# Patient Record
Sex: Male | Born: 1975 | Hispanic: No | Marital: Married
Health system: Southern US, Community
[De-identification: ages and names within clinical notes are randomized; demographics above are authoritative.]

## PROBLEM LIST (undated history)

## (undated) DIAGNOSIS — E785 Hyperlipidemia, unspecified: Secondary | ICD-10-CM

## (undated) DIAGNOSIS — E119 Type 2 diabetes mellitus without complications: Secondary | ICD-10-CM

## (undated) DIAGNOSIS — M109 Gout, unspecified: Secondary | ICD-10-CM

## (undated) HISTORY — DX: Type 2 diabetes mellitus without complications: E11.9

## (undated) HISTORY — DX: Gout, unspecified: M10.9

## (undated) HISTORY — DX: Hyperlipidemia, unspecified: E78.5

## (undated) HISTORY — PX: APPENDECTOMY: SHX54

---

## 2015-10-11 ENCOUNTER — Ambulatory Visit (INDEPENDENT_AMBULATORY_CARE_PROVIDER_SITE_OTHER): Payer: BLUE CROSS/BLUE SHIELD | Admitting: Family Medicine

## 2015-10-11 VITALS — BP 124/70 | HR 75 | Temp 98.3°F | Resp 18 | Ht 68.0 in | Wt 191.4 lb

## 2015-10-11 DIAGNOSIS — S61219A Laceration without foreign body of unspecified finger without damage to nail, initial encounter: Secondary | ICD-10-CM

## 2015-10-11 DIAGNOSIS — S61210A Laceration without foreign body of right index finger without damage to nail, initial encounter: Secondary | ICD-10-CM

## 2015-10-11 DIAGNOSIS — Z23 Encounter for immunization: Secondary | ICD-10-CM | POA: Diagnosis not present

## 2015-10-11 NOTE — Progress Notes (Signed)
   Subjective:  This chart was scribed for Norberto SorensonEva Shaw MD, by Veverly FellsHatice Demirci,scribe, at [facility].  This patient was seen in room 1 and the patient's care was started at 1:49 PM.    Patient ID: Micheal Herrera, male    DOB: 1976-12-03, 39 y.o.   MRN: [id number] Chief Complaint  Patient presents with  . OTHER    cut finger right hand    HPI  HPI Comments: Micheal Herrera is a 39 y.o. male who presents to the [facility] complaining of a cut on his right index finger (on stainless steel) which occurred prior to arrival while moving things around. Patient does not recall his last tetanus shot.  Patient denies feeling dizzy currently.  He has no other complaints or concerns today.    Past Medical History  Diagnosis Date  . Diabetes mellitus without complication (HCC)   . Hyperlipidemia   . Gout     No current outpatient prescriptions on file prior to visit.   No current facility-administered medications on file prior to visit.    Allergies  Allergen Reactions  . Penicillins          Review of Systems  Constitutional: Negative for fever and chills.  Eyes: Negative for pain, redness and itching.  Respiratory: Negative for cough and shortness of breath.   Gastrointestinal: Negative for nausea and vomiting.  Musculoskeletal: Negative for neck pain and neck stiffness.  Skin: Positive for wound.  Neurological: Negative for syncope and speech difficulty.       Objective:   Physical Exam  Constitutional: He is oriented to person, place, and time. He appears well-developed and well-nourished.  HENT:  Head: Normocephalic and atraumatic.  Eyes: Pupils are equal, round, and reactive to light.  Pulmonary/Chest: No respiratory distress.  Neurological: He is alert and oriented to person, place, and time.  Skin: Skin is warm and dry.  Right index finger: 1 cm by .5 cm missing divot in his palm.  Looks like it is to the base of the fat and does not  appear to break though the underlying fascia.  Not hemostatic and no known debris.    Filed Vitals:   10/11/15 1332  BP: 124/70  Pulse: 75  Temp: 98.3 F (36.8 C)  TempSrc: Oral  Resp: 18  Height: 5\' 8"  (1.727 m)  Weight: 191 lb 6.4 oz (86.818 kg)  SpO2: 98%          Assessment & Plan:   1. Laceration of finger of right hand, initial encounter     Orders Placed This Encounter  Procedures  . Tdap vaccine greater than or equal to 7yo IM    Meds ordered this encounter  Medications  . metFORMIN (GLUMETZA) 1000 MG (MOD) 24 hr tablet    Sig: Take 1,000 mg by mouth daily with breakfast.  . simvastatin (ZOCOR) 10 MG tablet    Sig: Take 10 mg by mouth daily.  Marland Kitchen. allopurinol (ZYLOPRIM) 100 MG tablet    Sig: Take 100 mg by mouth daily.    I personally performed the services described in this documentation, which was scribed in my presence. The recorded information has been reviewed and considered, and addended by me as needed.  Norberto SorensonEva Shaw, MD MPH

## 2015-10-11 NOTE — Progress Notes (Signed)
Procedure Consent obtained. 2% lido local anesthesia. Pinrose drain applied. Cleaned with soap and water. Wound explored. Xeroform applied. Clean dressing placed.

## 2015-10-13 ENCOUNTER — Ambulatory Visit (INDEPENDENT_AMBULATORY_CARE_PROVIDER_SITE_OTHER): Payer: BLUE CROSS/BLUE SHIELD | Admitting: Physician Assistant

## 2015-10-13 VITALS — BP 126/80 | HR 90 | Temp 98.4°F | Resp 17 | Ht 67.5 in | Wt 192.0 lb

## 2015-10-13 DIAGNOSIS — Z5189 Encounter for other specified aftercare: Secondary | ICD-10-CM

## 2015-10-13 NOTE — Progress Notes (Signed)
   Subjective:    Patient ID: Micheal Herrera, male    DOB: 16-Oct-1976, 39 y.o.   MRN: [id number]  Chief Complaint  Patient presents with  . Follow-up    wound care    HPI  [age] yom returns for wound check.   Seen 2 days ago. Pinched right index finger fat pad between 2 metal pieces and lost small chunk from fat pad. We cleaned and applied xeroform dressing 2 days ago. Returns for recheck.   Has kept wrapped since seeing us. Denies fevers, chills. Denies drainage.   Review of Systems See HPI     Objective:   Physical Exam  Constitutional: He appears well-developed and well-nourished.  Non-toxic appearance. He does not have a sickly appearance. He does not appear ill. No distress.  BP 126/80 mmHg  Pulse 90  Temp(Src) 98.4 F (36.9 C) (Oral)  Resp 17  Ht 5' 7.5" (1.715 m)  Wt 192 lb (87.091 kg)  BMI 29.61 kg/m2  SpO2 98%   Skin:  Dressing removed from right index finger. Xeroform gauze left in place over 1cm abrasion. No purulence. No surrounding erythema. Wound rewrapped.       Assessment & Plan:   Visit for wound check --wound healing well --xeroform gauze left in place, wound rewrapped, no signs infection --instructed to clean with soap/water daily, continue to cover while working but leave open while at home --rtc with fevers, chills, purulence, surrounding erythema  [name] M. [name], PA-C Physician Assistant-Certified [facility]  10/13/2015 2:54 PM

## 2016-02-22 ENCOUNTER — Ambulatory Visit
Admission: RE | Admit: 2016-02-22 | Discharge: 2016-02-22 | Disposition: A | Discharge status: Home / Self Care | Payer: BLUE CROSS/BLUE SHIELD | Source: Ambulatory Visit | Attending: Family Medicine | Admitting: Family Medicine

## 2016-02-22 ENCOUNTER — Other Ambulatory Visit: Payer: Self-pay | Admitting: Family Medicine

## 2016-02-22 DIAGNOSIS — M5416 Radiculopathy, lumbar region: Secondary | ICD-10-CM

## 2016-02-23 ENCOUNTER — Ambulatory Visit
Admission: RE | Admit: 2016-02-23 | Discharge: 2016-02-23 | Disposition: A | Discharge status: Home / Self Care | Payer: BLUE CROSS/BLUE SHIELD | Source: Ambulatory Visit | Attending: Family Medicine | Admitting: Family Medicine

## 2016-02-23 ENCOUNTER — Other Ambulatory Visit: Payer: Self-pay | Admitting: Family Medicine

## 2016-02-23 DIAGNOSIS — M25512 Pain in left shoulder: Secondary | ICD-10-CM

## 2016-05-30 DIAGNOSIS — H52223 Regular astigmatism, bilateral: Secondary | ICD-10-CM | POA: Diagnosis not present

## 2016-05-30 DIAGNOSIS — E119 Type 2 diabetes mellitus without complications: Secondary | ICD-10-CM | POA: Diagnosis not present

## 2016-08-06 ENCOUNTER — Other Ambulatory Visit: Payer: Self-pay | Admitting: Family Medicine

## 2016-08-06 DIAGNOSIS — M25512 Pain in left shoulder: Secondary | ICD-10-CM

## 2016-08-10 ENCOUNTER — Ambulatory Visit
Admission: RE | Admit: 2016-08-10 | Discharge: 2016-08-10 | Disposition: A | Discharge status: Home / Self Care | Payer: BLUE CROSS/BLUE SHIELD | Source: Ambulatory Visit | Attending: Family Medicine | Admitting: Family Medicine

## 2016-08-10 DIAGNOSIS — M25512 Pain in left shoulder: Secondary | ICD-10-CM

## 2016-08-10 DIAGNOSIS — S43432A Superior glenoid labrum lesion of left shoulder, initial encounter: Secondary | ICD-10-CM | POA: Diagnosis not present

## 2016-08-20 DIAGNOSIS — M7542 Impingement syndrome of left shoulder: Secondary | ICD-10-CM | POA: Diagnosis not present

## 2016-08-20 DIAGNOSIS — M25512 Pain in left shoulder: Secondary | ICD-10-CM | POA: Diagnosis not present

## 2016-08-20 DIAGNOSIS — M7582 Other shoulder lesions, left shoulder: Secondary | ICD-10-CM | POA: Diagnosis not present

## 2016-12-06 DIAGNOSIS — E119 Type 2 diabetes mellitus without complications: Secondary | ICD-10-CM | POA: Diagnosis not present

## 2016-12-06 DIAGNOSIS — M109 Gout, unspecified: Secondary | ICD-10-CM | POA: Diagnosis not present

## 2016-12-06 DIAGNOSIS — E785 Hyperlipidemia, unspecified: Secondary | ICD-10-CM | POA: Diagnosis not present

## 2016-12-10 DIAGNOSIS — E790 Hyperuricemia without signs of inflammatory arthritis and tophaceous disease: Secondary | ICD-10-CM | POA: Diagnosis not present

## 2016-12-10 DIAGNOSIS — R5383 Other fatigue: Secondary | ICD-10-CM | POA: Diagnosis not present

## 2016-12-10 DIAGNOSIS — M109 Gout, unspecified: Secondary | ICD-10-CM | POA: Diagnosis not present

## 2016-12-10 DIAGNOSIS — E119 Type 2 diabetes mellitus without complications: Secondary | ICD-10-CM | POA: Diagnosis not present

## 2016-12-10 DIAGNOSIS — E785 Hyperlipidemia, unspecified: Secondary | ICD-10-CM | POA: Diagnosis not present

## 2016-12-23 DIAGNOSIS — E291 Testicular hypofunction: Secondary | ICD-10-CM | POA: Diagnosis not present

## 2017-03-20 DIAGNOSIS — M7070 Other bursitis of hip, unspecified hip: Secondary | ICD-10-CM | POA: Diagnosis not present

## 2017-08-15 DIAGNOSIS — M109 Gout, unspecified: Secondary | ICD-10-CM | POA: Diagnosis not present

## 2017-08-15 DIAGNOSIS — E1165 Type 2 diabetes mellitus with hyperglycemia: Secondary | ICD-10-CM | POA: Diagnosis not present

## 2017-08-15 DIAGNOSIS — E785 Hyperlipidemia, unspecified: Secondary | ICD-10-CM | POA: Diagnosis not present

## 2017-08-15 DIAGNOSIS — Z7984 Long term (current) use of oral hypoglycemic drugs: Secondary | ICD-10-CM | POA: Diagnosis not present

## 2017-08-15 DIAGNOSIS — E291 Testicular hypofunction: Secondary | ICD-10-CM | POA: Diagnosis not present

## 2017-08-19 DIAGNOSIS — E291 Testicular hypofunction: Secondary | ICD-10-CM | POA: Diagnosis not present

## 2017-08-19 DIAGNOSIS — E119 Type 2 diabetes mellitus without complications: Secondary | ICD-10-CM | POA: Diagnosis not present

## 2017-08-19 DIAGNOSIS — E785 Hyperlipidemia, unspecified: Secondary | ICD-10-CM | POA: Diagnosis not present

## 2017-08-19 DIAGNOSIS — E790 Hyperuricemia without signs of inflammatory arthritis and tophaceous disease: Secondary | ICD-10-CM | POA: Diagnosis not present

## 2017-08-28 DIAGNOSIS — E291 Testicular hypofunction: Secondary | ICD-10-CM | POA: Diagnosis not present

## 2017-10-28 DIAGNOSIS — H43812 Vitreous degeneration, left eye: Secondary | ICD-10-CM | POA: Diagnosis not present

## 2017-10-28 DIAGNOSIS — E119 Type 2 diabetes mellitus without complications: Secondary | ICD-10-CM | POA: Diagnosis not present

## 2018-03-19 DIAGNOSIS — E782 Mixed hyperlipidemia: Secondary | ICD-10-CM | POA: Diagnosis not present

## 2018-03-19 DIAGNOSIS — E790 Hyperuricemia without signs of inflammatory arthritis and tophaceous disease: Secondary | ICD-10-CM | POA: Diagnosis not present

## 2018-03-19 DIAGNOSIS — M1990 Unspecified osteoarthritis, unspecified site: Secondary | ICD-10-CM | POA: Diagnosis not present

## 2018-03-19 DIAGNOSIS — E119 Type 2 diabetes mellitus without complications: Secondary | ICD-10-CM | POA: Diagnosis not present

## 2018-03-19 DIAGNOSIS — Z79899 Other long term (current) drug therapy: Secondary | ICD-10-CM | POA: Diagnosis not present

## 2018-03-19 DIAGNOSIS — E291 Testicular hypofunction: Secondary | ICD-10-CM | POA: Diagnosis not present

## 2018-03-19 DIAGNOSIS — E1165 Type 2 diabetes mellitus with hyperglycemia: Secondary | ICD-10-CM | POA: Diagnosis not present

## 2018-03-19 DIAGNOSIS — Z125 Encounter for screening for malignant neoplasm of prostate: Secondary | ICD-10-CM | POA: Diagnosis not present

## 2018-10-20 DIAGNOSIS — K649 Unspecified hemorrhoids: Secondary | ICD-10-CM | POA: Diagnosis not present

## 2018-11-24 DIAGNOSIS — E790 Hyperuricemia without signs of inflammatory arthritis and tophaceous disease: Secondary | ICD-10-CM | POA: Diagnosis not present

## 2018-11-24 DIAGNOSIS — E119 Type 2 diabetes mellitus without complications: Secondary | ICD-10-CM | POA: Diagnosis not present

## 2018-11-24 DIAGNOSIS — E782 Mixed hyperlipidemia: Secondary | ICD-10-CM | POA: Diagnosis not present

## 2018-11-24 DIAGNOSIS — Z5181 Encounter for therapeutic drug level monitoring: Secondary | ICD-10-CM | POA: Diagnosis not present

## 2018-11-24 DIAGNOSIS — M1990 Unspecified osteoarthritis, unspecified site: Secondary | ICD-10-CM | POA: Diagnosis not present

## 2018-11-24 DIAGNOSIS — E291 Testicular hypofunction: Secondary | ICD-10-CM | POA: Diagnosis not present

## 2019-05-07 DIAGNOSIS — R05 Cough: Secondary | ICD-10-CM | POA: Diagnosis not present

## 2019-05-07 DIAGNOSIS — B351 Tinea unguium: Secondary | ICD-10-CM | POA: Diagnosis not present

## 2019-05-07 DIAGNOSIS — E790 Hyperuricemia without signs of inflammatory arthritis and tophaceous disease: Secondary | ICD-10-CM | POA: Diagnosis not present

## 2019-05-07 DIAGNOSIS — E119 Type 2 diabetes mellitus without complications: Secondary | ICD-10-CM | POA: Diagnosis not present

## 2019-05-18 DIAGNOSIS — Z7289 Other problems related to lifestyle: Secondary | ICD-10-CM | POA: Diagnosis not present

## 2019-05-18 DIAGNOSIS — K219 Gastro-esophageal reflux disease without esophagitis: Secondary | ICD-10-CM | POA: Diagnosis not present

## 2019-05-18 DIAGNOSIS — J342 Deviated nasal septum: Secondary | ICD-10-CM | POA: Diagnosis not present

## 2019-05-18 DIAGNOSIS — R0981 Nasal congestion: Secondary | ICD-10-CM | POA: Diagnosis not present

## 2019-05-27 ENCOUNTER — Other Ambulatory Visit: Payer: Self-pay | Admitting: Otolaryngology

## 2019-05-27 DIAGNOSIS — R05 Cough: Secondary | ICD-10-CM

## 2019-05-27 DIAGNOSIS — R053 Chronic cough: Secondary | ICD-10-CM

## 2019-05-27 DIAGNOSIS — Z20828 Contact with and (suspected) exposure to other viral communicable diseases: Secondary | ICD-10-CM | POA: Diagnosis not present

## 2019-06-01 ENCOUNTER — Ambulatory Visit
Admission: RE | Admit: 2019-06-01 | Discharge: 2019-06-01 | Disposition: A | Discharge status: Home / Self Care | Payer: BC Managed Care – PPO | Source: Ambulatory Visit | Attending: Otolaryngology | Admitting: Otolaryngology

## 2019-06-01 DIAGNOSIS — R053 Chronic cough: Secondary | ICD-10-CM

## 2019-06-01 DIAGNOSIS — R05 Cough: Secondary | ICD-10-CM | POA: Diagnosis not present

## 2019-06-15 DIAGNOSIS — E119 Type 2 diabetes mellitus without complications: Secondary | ICD-10-CM | POA: Diagnosis not present

## 2019-06-15 DIAGNOSIS — H43812 Vitreous degeneration, left eye: Secondary | ICD-10-CM | POA: Diagnosis not present

## 2019-09-07 DIAGNOSIS — D2271 Melanocytic nevi of right lower limb, including hip: Secondary | ICD-10-CM | POA: Diagnosis not present

## 2019-09-07 DIAGNOSIS — L821 Other seborrheic keratosis: Secondary | ICD-10-CM | POA: Diagnosis not present

## 2019-09-07 DIAGNOSIS — B351 Tinea unguium: Secondary | ICD-10-CM | POA: Diagnosis not present

## 2019-09-07 DIAGNOSIS — L858 Other specified epidermal thickening: Secondary | ICD-10-CM | POA: Diagnosis not present

## 2019-09-13 DIAGNOSIS — Z1159 Encounter for screening for other viral diseases: Secondary | ICD-10-CM | POA: Diagnosis not present

## 2019-09-20 DIAGNOSIS — E790 Hyperuricemia without signs of inflammatory arthritis and tophaceous disease: Secondary | ICD-10-CM | POA: Diagnosis not present

## 2019-09-20 DIAGNOSIS — E119 Type 2 diabetes mellitus without complications: Secondary | ICD-10-CM | POA: Diagnosis not present

## 2019-09-20 DIAGNOSIS — Z5181 Encounter for therapeutic drug level monitoring: Secondary | ICD-10-CM | POA: Diagnosis not present

## 2019-09-20 DIAGNOSIS — E291 Testicular hypofunction: Secondary | ICD-10-CM | POA: Diagnosis not present

## 2019-09-21 DIAGNOSIS — E119 Type 2 diabetes mellitus without complications: Secondary | ICD-10-CM | POA: Diagnosis not present

## 2019-09-21 DIAGNOSIS — E291 Testicular hypofunction: Secondary | ICD-10-CM | POA: Diagnosis not present

## 2019-09-21 DIAGNOSIS — Z5181 Encounter for therapeutic drug level monitoring: Secondary | ICD-10-CM | POA: Diagnosis not present

## 2019-09-21 DIAGNOSIS — E790 Hyperuricemia without signs of inflammatory arthritis and tophaceous disease: Secondary | ICD-10-CM | POA: Diagnosis not present

## 2019-11-10 DIAGNOSIS — Z20828 Contact with and (suspected) exposure to other viral communicable diseases: Secondary | ICD-10-CM | POA: Diagnosis not present

## 2019-12-13 DIAGNOSIS — Z20828 Contact with and (suspected) exposure to other viral communicable diseases: Secondary | ICD-10-CM | POA: Diagnosis not present

## 2019-12-14 DIAGNOSIS — Z20828 Contact with and (suspected) exposure to other viral communicable diseases: Secondary | ICD-10-CM | POA: Diagnosis not present

## 2019-12-14 DIAGNOSIS — J349 Unspecified disorder of nose and nasal sinuses: Secondary | ICD-10-CM | POA: Diagnosis not present

## 2019-12-14 DIAGNOSIS — U071 COVID-19: Secondary | ICD-10-CM | POA: Diagnosis not present

## 2019-12-28 DIAGNOSIS — R899 Unspecified abnormal finding in specimens from other organs, systems and tissues: Secondary | ICD-10-CM | POA: Diagnosis not present

## 2019-12-28 DIAGNOSIS — J3489 Other specified disorders of nose and nasal sinuses: Secondary | ICD-10-CM | POA: Diagnosis not present

## 2019-12-28 DIAGNOSIS — J342 Deviated nasal septum: Secondary | ICD-10-CM | POA: Diagnosis not present

## 2019-12-28 DIAGNOSIS — M950 Acquired deformity of nose: Secondary | ICD-10-CM | POA: Diagnosis not present

## 2020-01-11 DIAGNOSIS — Z20822 Contact with and (suspected) exposure to covid-19: Secondary | ICD-10-CM | POA: Diagnosis not present

## 2020-01-16 DIAGNOSIS — U071 COVID-19: Secondary | ICD-10-CM | POA: Diagnosis not present

## 2020-01-24 IMAGING — CR CHEST - 2 VIEW
2 series · 2 of 2 positions shown · non-contrast
Comparison: None.

CLINICAL DATA: Cough and wheezing for 4 months. Shortness of
breath.

EXAM:
CHEST - 2 VIEW

[w chest pa]
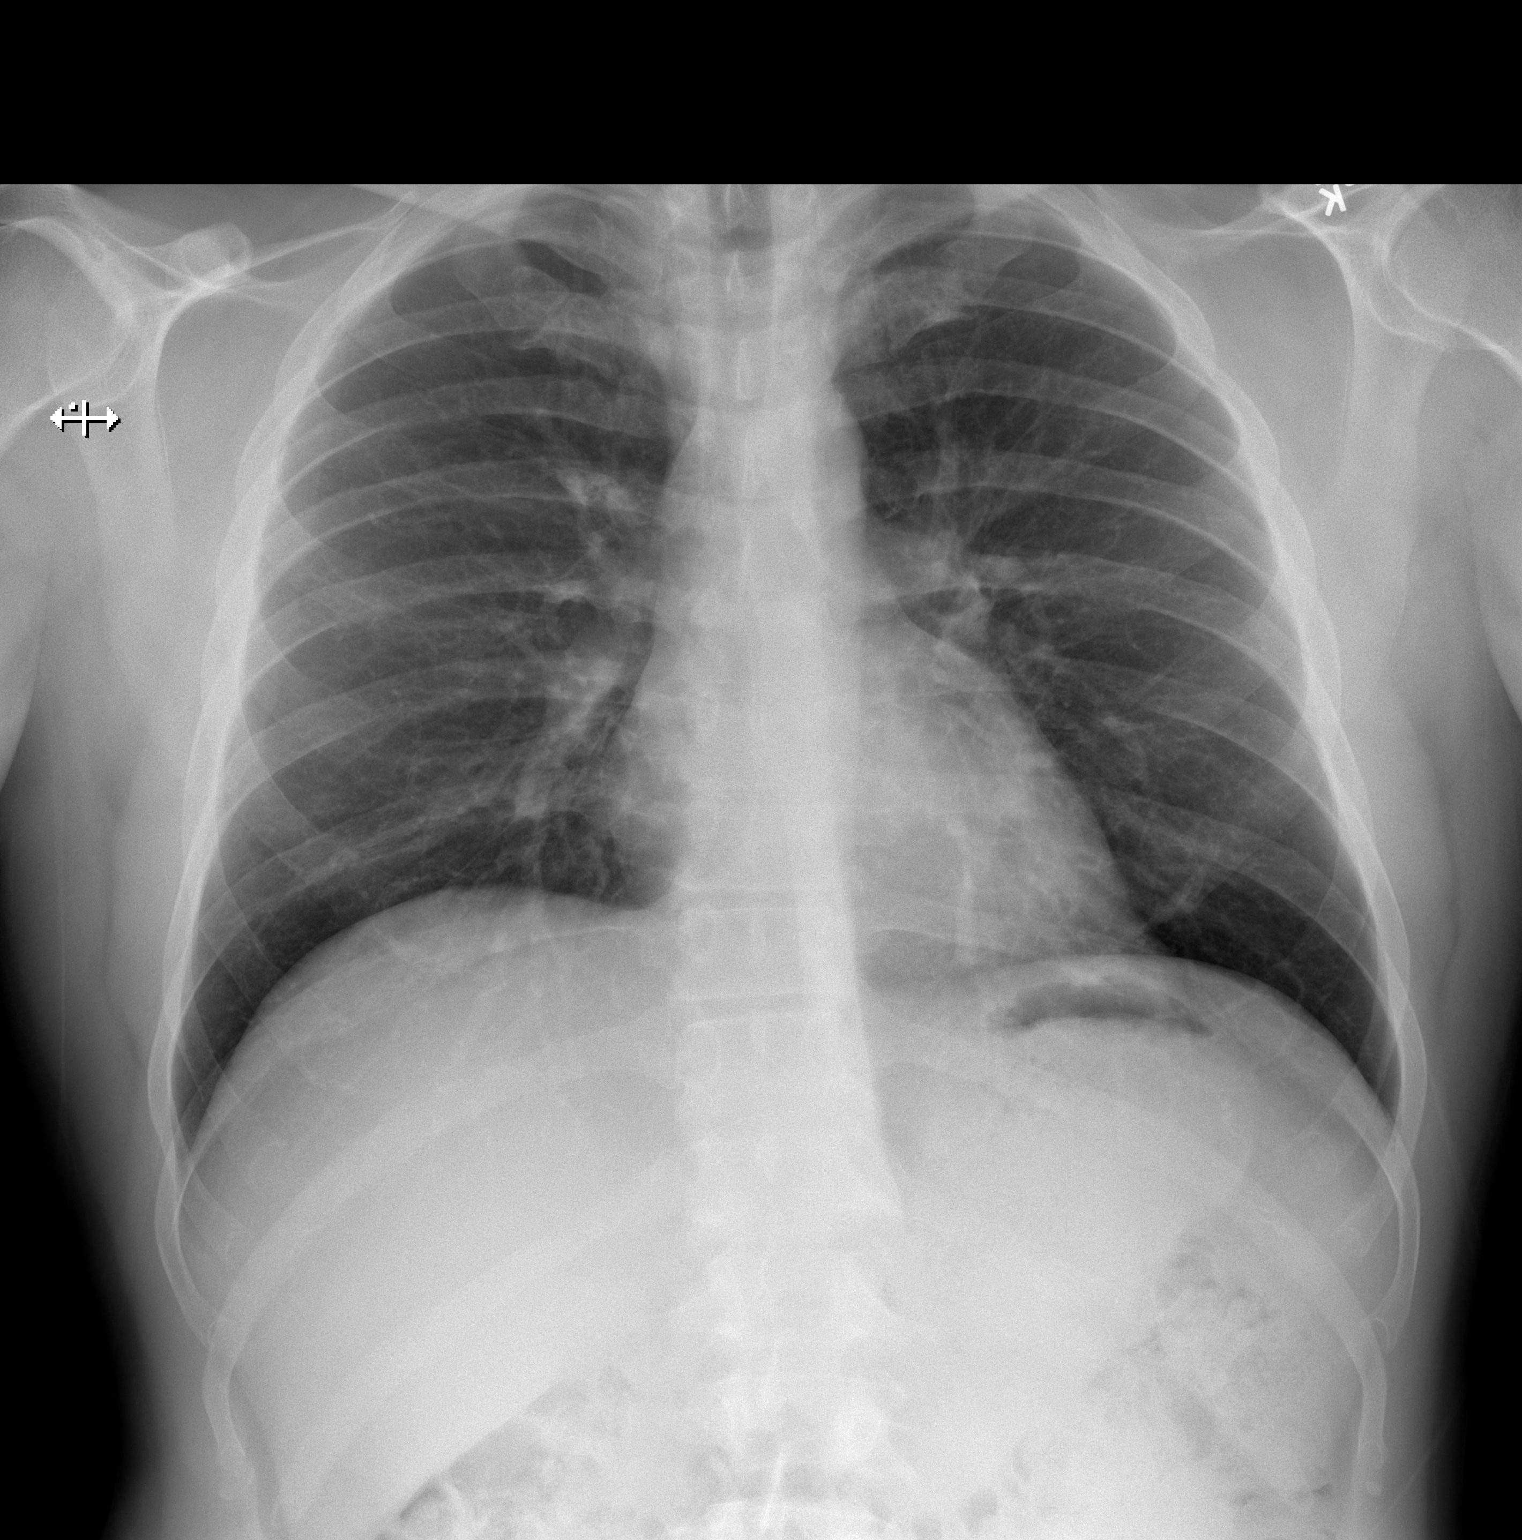

[w chest lat]
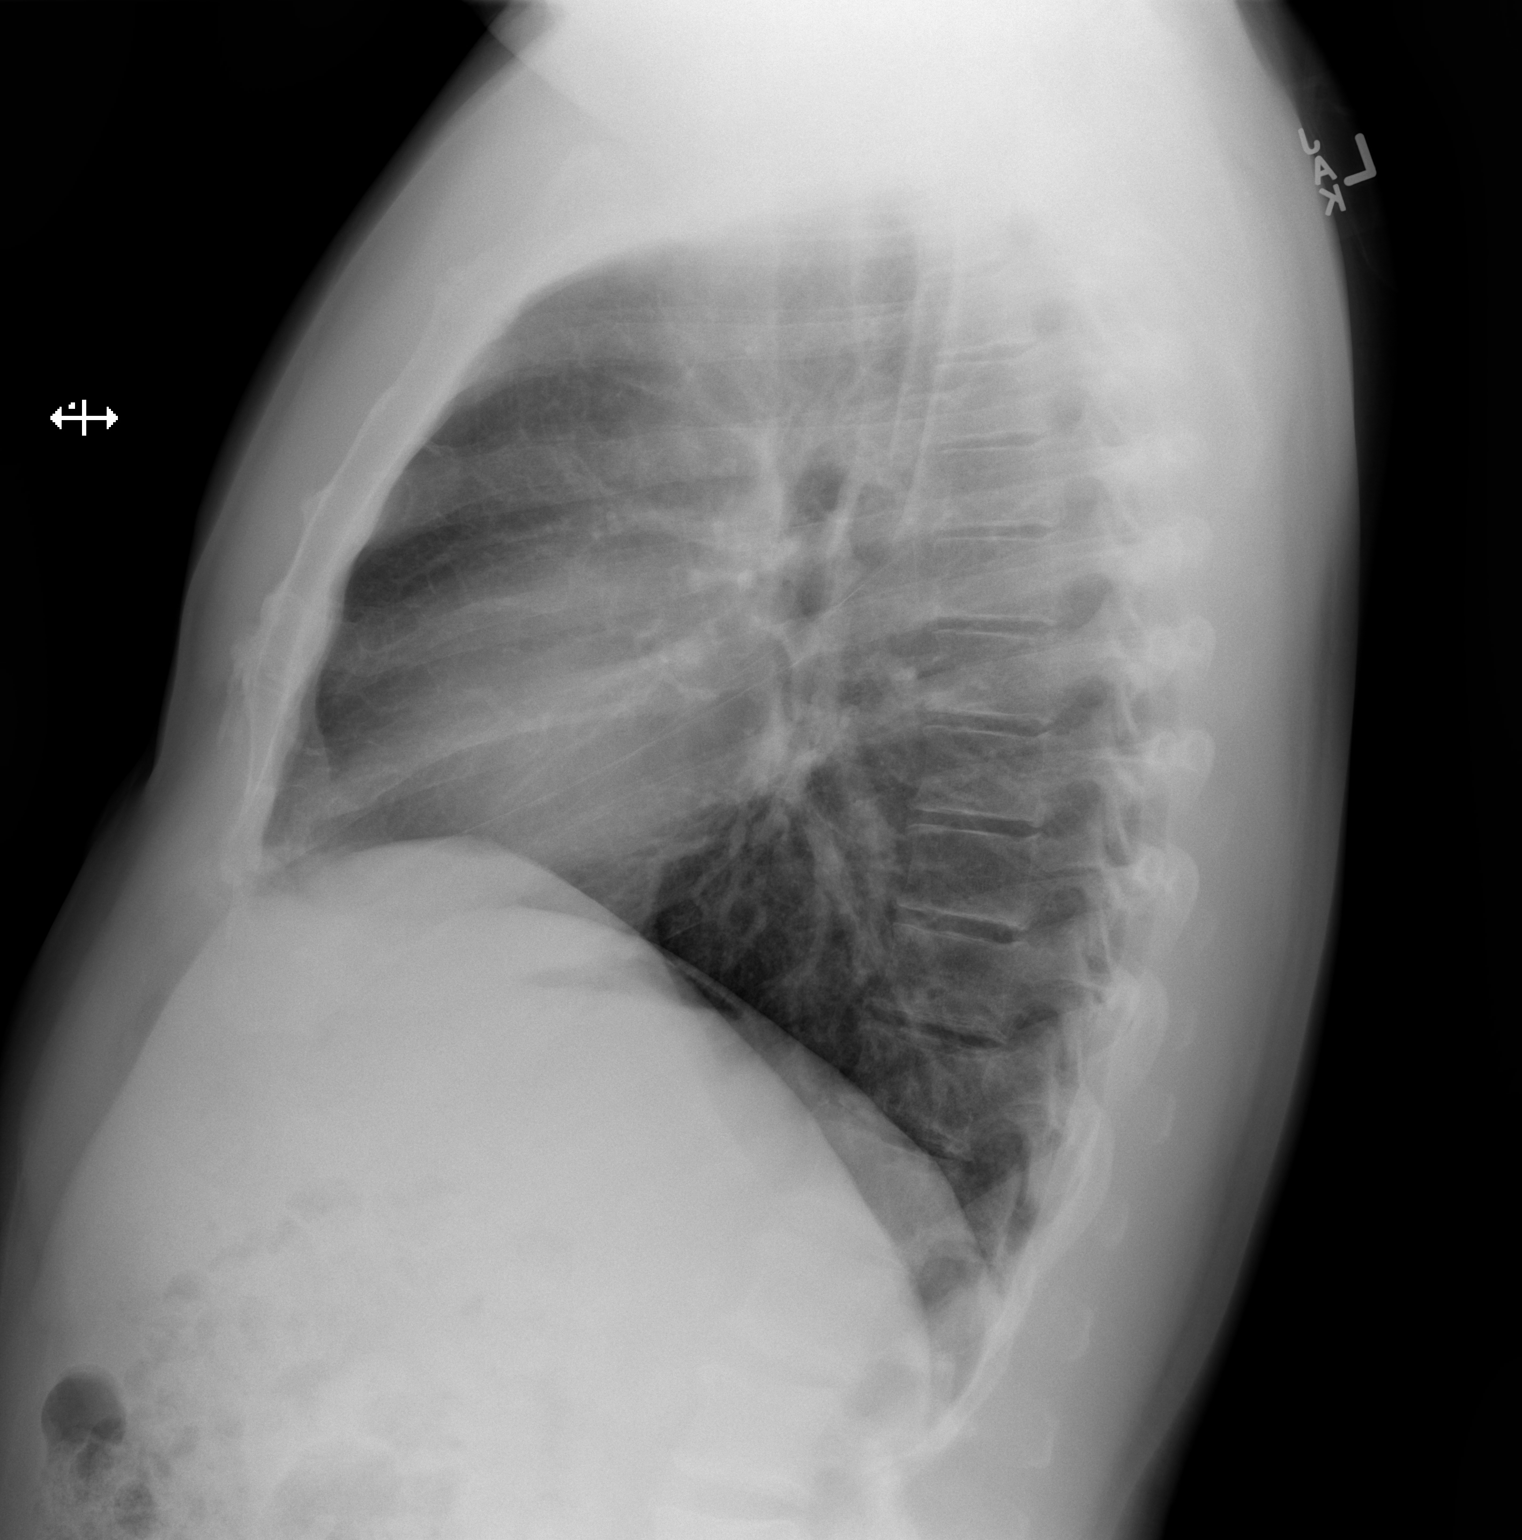

[2 of 2 positions shown; findings below may reference images not displayed]

FINDINGS: Lungs clear. Heart size normal. No pneumothorax or pleural fluid. No
bony abnormality.
IMPRESSION: Negative chest.

## 2020-03-16 DIAGNOSIS — Z23 Encounter for immunization: Secondary | ICD-10-CM | POA: Diagnosis not present

## 2020-04-20 DIAGNOSIS — L233 Allergic contact dermatitis due to drugs in contact with skin: Secondary | ICD-10-CM | POA: Diagnosis not present

## 2020-04-20 DIAGNOSIS — B360 Pityriasis versicolor: Secondary | ICD-10-CM | POA: Diagnosis not present

## 2020-04-20 DIAGNOSIS — L2089 Other atopic dermatitis: Secondary | ICD-10-CM | POA: Diagnosis not present

## 2020-05-30 DIAGNOSIS — E119 Type 2 diabetes mellitus without complications: Secondary | ICD-10-CM | POA: Diagnosis not present

## 2020-05-30 DIAGNOSIS — E790 Hyperuricemia without signs of inflammatory arthritis and tophaceous disease: Secondary | ICD-10-CM | POA: Diagnosis not present

## 2020-05-30 DIAGNOSIS — E782 Mixed hyperlipidemia: Secondary | ICD-10-CM | POA: Diagnosis not present

## 2020-06-02 DIAGNOSIS — R899 Unspecified abnormal finding in specimens from other organs, systems and tissues: Secondary | ICD-10-CM | POA: Diagnosis not present

## 2020-06-02 DIAGNOSIS — E790 Hyperuricemia without signs of inflammatory arthritis and tophaceous disease: Secondary | ICD-10-CM | POA: Diagnosis not present

## 2020-06-02 DIAGNOSIS — E291 Testicular hypofunction: Secondary | ICD-10-CM | POA: Diagnosis not present

## 2020-06-02 DIAGNOSIS — Z5181 Encounter for therapeutic drug level monitoring: Secondary | ICD-10-CM | POA: Diagnosis not present

## 2021-01-08 DIAGNOSIS — J3489 Other specified disorders of nose and nasal sinuses: Secondary | ICD-10-CM | POA: Diagnosis not present

## 2021-01-08 DIAGNOSIS — J342 Deviated nasal septum: Secondary | ICD-10-CM | POA: Diagnosis not present

## 2021-01-08 DIAGNOSIS — J343 Hypertrophy of nasal turbinates: Secondary | ICD-10-CM | POA: Diagnosis not present

## 2021-01-08 DIAGNOSIS — M950 Acquired deformity of nose: Secondary | ICD-10-CM | POA: Diagnosis not present

## 2021-02-12 DIAGNOSIS — E782 Mixed hyperlipidemia: Secondary | ICD-10-CM | POA: Diagnosis not present

## 2021-02-12 DIAGNOSIS — E291 Testicular hypofunction: Secondary | ICD-10-CM | POA: Diagnosis not present

## 2021-02-12 DIAGNOSIS — E790 Hyperuricemia without signs of inflammatory arthritis and tophaceous disease: Secondary | ICD-10-CM | POA: Diagnosis not present

## 2021-02-12 DIAGNOSIS — E1165 Type 2 diabetes mellitus with hyperglycemia: Secondary | ICD-10-CM | POA: Diagnosis not present

## 2021-02-12 DIAGNOSIS — Z5181 Encounter for therapeutic drug level monitoring: Secondary | ICD-10-CM | POA: Diagnosis not present

## 2021-02-19 DIAGNOSIS — G8929 Other chronic pain: Secondary | ICD-10-CM | POA: Diagnosis not present

## 2021-02-19 DIAGNOSIS — M7582 Other shoulder lesions, left shoulder: Secondary | ICD-10-CM | POA: Diagnosis not present

## 2021-02-19 DIAGNOSIS — M545 Low back pain, unspecified: Secondary | ICD-10-CM | POA: Diagnosis not present

## 2021-03-06 DIAGNOSIS — E1165 Type 2 diabetes mellitus with hyperglycemia: Secondary | ICD-10-CM | POA: Diagnosis not present

## 2021-03-06 DIAGNOSIS — E782 Mixed hyperlipidemia: Secondary | ICD-10-CM | POA: Diagnosis not present

## 2021-03-06 DIAGNOSIS — R899 Unspecified abnormal finding in specimens from other organs, systems and tissues: Secondary | ICD-10-CM | POA: Diagnosis not present

## 2021-03-06 DIAGNOSIS — E790 Hyperuricemia without signs of inflammatory arthritis and tophaceous disease: Secondary | ICD-10-CM | POA: Diagnosis not present

## 2021-04-17 DIAGNOSIS — Z09 Encounter for follow-up examination after completed treatment for conditions other than malignant neoplasm: Secondary | ICD-10-CM | POA: Diagnosis not present

## 2021-04-17 DIAGNOSIS — Z8709 Personal history of other diseases of the respiratory system: Secondary | ICD-10-CM | POA: Diagnosis not present

## 2021-04-17 DIAGNOSIS — Z8739 Personal history of other diseases of the musculoskeletal system and connective tissue: Secondary | ICD-10-CM | POA: Diagnosis not present

## 2021-06-13 DIAGNOSIS — E291 Testicular hypofunction: Secondary | ICD-10-CM | POA: Diagnosis not present

## 2021-06-13 DIAGNOSIS — E790 Hyperuricemia without signs of inflammatory arthritis and tophaceous disease: Secondary | ICD-10-CM | POA: Diagnosis not present

## 2021-06-13 DIAGNOSIS — Z5181 Encounter for therapeutic drug level monitoring: Secondary | ICD-10-CM | POA: Diagnosis not present

## 2021-06-13 DIAGNOSIS — R899 Unspecified abnormal finding in specimens from other organs, systems and tissues: Secondary | ICD-10-CM | POA: Diagnosis not present

## 2021-06-13 DIAGNOSIS — E1165 Type 2 diabetes mellitus with hyperglycemia: Secondary | ICD-10-CM | POA: Diagnosis not present

## 2021-06-13 DIAGNOSIS — E782 Mixed hyperlipidemia: Secondary | ICD-10-CM | POA: Diagnosis not present

## 2021-06-15 DIAGNOSIS — H6502 Acute serous otitis media, left ear: Secondary | ICD-10-CM | POA: Diagnosis not present

## 2021-06-22 DIAGNOSIS — E790 Hyperuricemia without signs of inflammatory arthritis and tophaceous disease: Secondary | ICD-10-CM | POA: Diagnosis not present

## 2021-06-22 DIAGNOSIS — E1165 Type 2 diabetes mellitus with hyperglycemia: Secondary | ICD-10-CM | POA: Diagnosis not present

## 2021-06-22 DIAGNOSIS — R899 Unspecified abnormal finding in specimens from other organs, systems and tissues: Secondary | ICD-10-CM | POA: Diagnosis not present

## 2021-06-22 DIAGNOSIS — E782 Mixed hyperlipidemia: Secondary | ICD-10-CM | POA: Diagnosis not present

## 2021-08-29 DIAGNOSIS — E119 Type 2 diabetes mellitus without complications: Secondary | ICD-10-CM | POA: Diagnosis not present

## 2021-08-29 DIAGNOSIS — H43812 Vitreous degeneration, left eye: Secondary | ICD-10-CM | POA: Diagnosis not present

## 2021-08-29 DIAGNOSIS — H04123 Dry eye syndrome of bilateral lacrimal glands: Secondary | ICD-10-CM | POA: Diagnosis not present

## 2021-09-24 DIAGNOSIS — J3489 Other specified disorders of nose and nasal sinuses: Secondary | ICD-10-CM | POA: Diagnosis not present

## 2021-09-24 DIAGNOSIS — H9190 Unspecified hearing loss, unspecified ear: Secondary | ICD-10-CM | POA: Diagnosis not present

## 2021-09-24 DIAGNOSIS — H6592 Unspecified nonsuppurative otitis media, left ear: Secondary | ICD-10-CM | POA: Diagnosis not present

## 2022-05-07 DIAGNOSIS — B360 Pityriasis versicolor: Secondary | ICD-10-CM | POA: Diagnosis not present

## 2022-05-07 DIAGNOSIS — B353 Tinea pedis: Secondary | ICD-10-CM | POA: Diagnosis not present

## 2022-05-07 DIAGNOSIS — B351 Tinea unguium: Secondary | ICD-10-CM | POA: Diagnosis not present

## 2022-06-17 DIAGNOSIS — E291 Testicular hypofunction: Secondary | ICD-10-CM | POA: Diagnosis not present

## 2022-06-17 DIAGNOSIS — E782 Mixed hyperlipidemia: Secondary | ICD-10-CM | POA: Diagnosis not present

## 2022-06-17 DIAGNOSIS — Z5181 Encounter for therapeutic drug level monitoring: Secondary | ICD-10-CM | POA: Diagnosis not present

## 2022-06-17 DIAGNOSIS — E1165 Type 2 diabetes mellitus with hyperglycemia: Secondary | ICD-10-CM | POA: Diagnosis not present

## 2022-06-17 DIAGNOSIS — E790 Hyperuricemia without signs of inflammatory arthritis and tophaceous disease: Secondary | ICD-10-CM | POA: Diagnosis not present

## 2022-09-11 DIAGNOSIS — E1165 Type 2 diabetes mellitus with hyperglycemia: Secondary | ICD-10-CM | POA: Diagnosis not present

## 2022-09-11 DIAGNOSIS — Z6830 Body mass index (BMI) 30.0-30.9, adult: Secondary | ICD-10-CM | POA: Diagnosis not present

## 2022-09-11 DIAGNOSIS — Z0000 Encounter for general adult medical examination without abnormal findings: Secondary | ICD-10-CM | POA: Diagnosis not present

## 2022-09-11 DIAGNOSIS — E291 Testicular hypofunction: Secondary | ICD-10-CM | POA: Diagnosis not present

## 2022-09-11 DIAGNOSIS — E782 Mixed hyperlipidemia: Secondary | ICD-10-CM | POA: Diagnosis not present

## 2022-12-18 DIAGNOSIS — E1165 Type 2 diabetes mellitus with hyperglycemia: Secondary | ICD-10-CM | POA: Diagnosis not present

## 2022-12-18 DIAGNOSIS — E785 Hyperlipidemia, unspecified: Secondary | ICD-10-CM | POA: Diagnosis not present

## 2022-12-18 DIAGNOSIS — E291 Testicular hypofunction: Secondary | ICD-10-CM | POA: Diagnosis not present

## 2023-01-20 DIAGNOSIS — E349 Endocrine disorder, unspecified: Secondary | ICD-10-CM | POA: Diagnosis not present

## 2023-01-20 DIAGNOSIS — N5201 Erectile dysfunction due to arterial insufficiency: Secondary | ICD-10-CM | POA: Diagnosis not present

## 2023-01-31 DIAGNOSIS — K219 Gastro-esophageal reflux disease without esophagitis: Secondary | ICD-10-CM | POA: Diagnosis not present

## 2023-02-21 DIAGNOSIS — D120 Benign neoplasm of cecum: Secondary | ICD-10-CM | POA: Diagnosis not present

## 2023-02-21 DIAGNOSIS — K621 Rectal polyp: Secondary | ICD-10-CM | POA: Diagnosis not present

## 2023-02-21 DIAGNOSIS — K2930 Chronic superficial gastritis without bleeding: Secondary | ICD-10-CM | POA: Diagnosis not present

## 2023-02-21 DIAGNOSIS — K2100 Gastro-esophageal reflux disease with esophagitis, without bleeding: Secondary | ICD-10-CM | POA: Diagnosis not present

## 2023-02-21 DIAGNOSIS — K648 Other hemorrhoids: Secondary | ICD-10-CM | POA: Diagnosis not present

## 2023-02-21 DIAGNOSIS — K635 Polyp of colon: Secondary | ICD-10-CM | POA: Diagnosis not present

## 2023-02-21 DIAGNOSIS — K219 Gastro-esophageal reflux disease without esophagitis: Secondary | ICD-10-CM | POA: Diagnosis not present

## 2023-02-21 DIAGNOSIS — Z1211 Encounter for screening for malignant neoplasm of colon: Secondary | ICD-10-CM | POA: Diagnosis not present

## 2023-03-19 DIAGNOSIS — E291 Testicular hypofunction: Secondary | ICD-10-CM | POA: Diagnosis not present

## 2023-03-19 DIAGNOSIS — K219 Gastro-esophageal reflux disease without esophagitis: Secondary | ICD-10-CM | POA: Diagnosis not present

## 2023-03-19 DIAGNOSIS — E1165 Type 2 diabetes mellitus with hyperglycemia: Secondary | ICD-10-CM | POA: Diagnosis not present

## 2023-03-21 DIAGNOSIS — E1165 Type 2 diabetes mellitus with hyperglycemia: Secondary | ICD-10-CM | POA: Diagnosis not present

## 2023-03-21 DIAGNOSIS — E291 Testicular hypofunction: Secondary | ICD-10-CM | POA: Diagnosis not present

## 2023-05-23 DIAGNOSIS — I10 Essential (primary) hypertension: Secondary | ICD-10-CM | POA: Diagnosis not present

## 2023-08-06 DIAGNOSIS — S161XXA Strain of muscle, fascia and tendon at neck level, initial encounter: Secondary | ICD-10-CM | POA: Diagnosis not present

## 2023-08-27 DIAGNOSIS — M542 Cervicalgia: Secondary | ICD-10-CM | POA: Diagnosis not present

## 2023-09-08 DIAGNOSIS — E1165 Type 2 diabetes mellitus with hyperglycemia: Secondary | ICD-10-CM | POA: Diagnosis not present

## 2023-09-08 DIAGNOSIS — E291 Testicular hypofunction: Secondary | ICD-10-CM | POA: Diagnosis not present

## 2023-09-08 DIAGNOSIS — E785 Hyperlipidemia, unspecified: Secondary | ICD-10-CM | POA: Diagnosis not present

## 2023-09-08 DIAGNOSIS — I10 Essential (primary) hypertension: Secondary | ICD-10-CM | POA: Diagnosis not present

## 2023-09-10 DIAGNOSIS — Z0000 Encounter for general adult medical examination without abnormal findings: Secondary | ICD-10-CM | POA: Diagnosis not present

## 2023-09-29 DIAGNOSIS — M542 Cervicalgia: Secondary | ICD-10-CM | POA: Diagnosis not present

## 2023-10-03 DIAGNOSIS — M542 Cervicalgia: Secondary | ICD-10-CM | POA: Diagnosis not present

## 2023-10-06 DIAGNOSIS — M542 Cervicalgia: Secondary | ICD-10-CM | POA: Diagnosis not present

## 2023-10-13 DIAGNOSIS — M542 Cervicalgia: Secondary | ICD-10-CM | POA: Diagnosis not present

## 2023-10-20 DIAGNOSIS — M542 Cervicalgia: Secondary | ICD-10-CM | POA: Diagnosis not present

## 2023-10-24 DIAGNOSIS — E349 Endocrine disorder, unspecified: Secondary | ICD-10-CM | POA: Diagnosis not present

## 2023-10-24 DIAGNOSIS — Z125 Encounter for screening for malignant neoplasm of prostate: Secondary | ICD-10-CM | POA: Diagnosis not present

## 2023-10-24 DIAGNOSIS — N5201 Erectile dysfunction due to arterial insufficiency: Secondary | ICD-10-CM | POA: Diagnosis not present

## 2023-11-10 DIAGNOSIS — Z6830 Body mass index (BMI) 30.0-30.9, adult: Secondary | ICD-10-CM | POA: Diagnosis not present

## 2023-11-10 DIAGNOSIS — E291 Testicular hypofunction: Secondary | ICD-10-CM | POA: Diagnosis not present

## 2023-11-10 DIAGNOSIS — I10 Essential (primary) hypertension: Secondary | ICD-10-CM | POA: Diagnosis not present

## 2023-11-10 DIAGNOSIS — E1165 Type 2 diabetes mellitus with hyperglycemia: Secondary | ICD-10-CM | POA: Diagnosis not present

## 2023-12-19 DIAGNOSIS — E349 Endocrine disorder, unspecified: Secondary | ICD-10-CM | POA: Diagnosis not present

## 2024-01-01 DIAGNOSIS — E349 Endocrine disorder, unspecified: Secondary | ICD-10-CM | POA: Diagnosis not present

## 2024-02-04 DIAGNOSIS — E1165 Type 2 diabetes mellitus with hyperglycemia: Secondary | ICD-10-CM | POA: Diagnosis not present

## 2024-02-06 DIAGNOSIS — E1165 Type 2 diabetes mellitus with hyperglycemia: Secondary | ICD-10-CM | POA: Diagnosis not present

## 2024-02-06 DIAGNOSIS — B351 Tinea unguium: Secondary | ICD-10-CM | POA: Diagnosis not present

## 2024-02-06 DIAGNOSIS — I10 Essential (primary) hypertension: Secondary | ICD-10-CM | POA: Diagnosis not present

## 2024-02-06 DIAGNOSIS — E782 Mixed hyperlipidemia: Secondary | ICD-10-CM | POA: Diagnosis not present

## 2024-03-03 DIAGNOSIS — E349 Endocrine disorder, unspecified: Secondary | ICD-10-CM | POA: Diagnosis not present

## 2024-03-03 DIAGNOSIS — N5201 Erectile dysfunction due to arterial insufficiency: Secondary | ICD-10-CM | POA: Diagnosis not present

## 2024-04-15 DIAGNOSIS — M25511 Pain in right shoulder: Secondary | ICD-10-CM | POA: Diagnosis not present

## 2024-04-27 DIAGNOSIS — S46011A Strain of muscle(s) and tendon(s) of the rotator cuff of right shoulder, initial encounter: Secondary | ICD-10-CM | POA: Diagnosis not present

## 2024-04-27 DIAGNOSIS — M25511 Pain in right shoulder: Secondary | ICD-10-CM | POA: Diagnosis not present

## 2024-05-24 DIAGNOSIS — M545 Low back pain, unspecified: Secondary | ICD-10-CM | POA: Diagnosis not present

## 2024-06-23 DIAGNOSIS — M7541 Impingement syndrome of right shoulder: Secondary | ICD-10-CM | POA: Diagnosis not present

## 2024-06-23 DIAGNOSIS — M25812 Other specified joint disorders, left shoulder: Secondary | ICD-10-CM | POA: Diagnosis not present

## 2024-06-23 DIAGNOSIS — M24112 Other articular cartilage disorders, left shoulder: Secondary | ICD-10-CM | POA: Diagnosis not present

## 2024-06-23 DIAGNOSIS — M24111 Other articular cartilage disorders, right shoulder: Secondary | ICD-10-CM | POA: Diagnosis not present

## 2024-06-23 DIAGNOSIS — M19012 Primary osteoarthritis, left shoulder: Secondary | ICD-10-CM | POA: Diagnosis not present

## 2024-06-23 DIAGNOSIS — M75121 Complete rotator cuff tear or rupture of right shoulder, not specified as traumatic: Secondary | ICD-10-CM | POA: Diagnosis not present

## 2024-06-23 DIAGNOSIS — M7521 Bicipital tendinitis, right shoulder: Secondary | ICD-10-CM | POA: Diagnosis not present

## 2024-06-23 DIAGNOSIS — S46011A Strain of muscle(s) and tendon(s) of the rotator cuff of right shoulder, initial encounter: Secondary | ICD-10-CM | POA: Diagnosis not present

## 2024-06-23 DIAGNOSIS — S46112A Strain of muscle, fascia and tendon of long head of biceps, left arm, initial encounter: Secondary | ICD-10-CM | POA: Diagnosis not present

## 2024-06-23 DIAGNOSIS — G8918 Other acute postprocedural pain: Secondary | ICD-10-CM | POA: Diagnosis not present

## 2024-06-23 DIAGNOSIS — M7582 Other shoulder lesions, left shoulder: Secondary | ICD-10-CM | POA: Diagnosis not present

## 2024-06-23 DIAGNOSIS — X58XXXA Exposure to other specified factors, initial encounter: Secondary | ICD-10-CM | POA: Diagnosis not present

## 2024-06-23 DIAGNOSIS — Y999 Unspecified external cause status: Secondary | ICD-10-CM | POA: Diagnosis not present

## 2024-06-23 DIAGNOSIS — S43432A Superior glenoid labrum lesion of left shoulder, initial encounter: Secondary | ICD-10-CM | POA: Diagnosis not present

## 2024-06-29 DIAGNOSIS — M24111 Other articular cartilage disorders, right shoulder: Secondary | ICD-10-CM | POA: Diagnosis not present

## 2024-07-21 DIAGNOSIS — Z9889 Other specified postprocedural states: Secondary | ICD-10-CM | POA: Diagnosis not present

## 2024-07-21 DIAGNOSIS — R29898 Other symptoms and signs involving the musculoskeletal system: Secondary | ICD-10-CM | POA: Diagnosis not present

## 2024-07-21 DIAGNOSIS — M25511 Pain in right shoulder: Secondary | ICD-10-CM | POA: Diagnosis not present

## 2024-07-21 DIAGNOSIS — M25611 Stiffness of right shoulder, not elsewhere classified: Secondary | ICD-10-CM | POA: Diagnosis not present

## 2024-08-03 DIAGNOSIS — M25511 Pain in right shoulder: Secondary | ICD-10-CM | POA: Diagnosis not present

## 2024-08-03 DIAGNOSIS — M24111 Other articular cartilage disorders, right shoulder: Secondary | ICD-10-CM | POA: Diagnosis not present

## 2024-08-06 DIAGNOSIS — M25511 Pain in right shoulder: Secondary | ICD-10-CM | POA: Diagnosis not present

## 2024-08-09 DIAGNOSIS — M25511 Pain in right shoulder: Secondary | ICD-10-CM | POA: Diagnosis not present

## 2024-08-12 DIAGNOSIS — M25511 Pain in right shoulder: Secondary | ICD-10-CM | POA: Diagnosis not present

## 2024-08-17 DIAGNOSIS — R634 Abnormal weight loss: Secondary | ICD-10-CM | POA: Diagnosis not present

## 2024-08-17 DIAGNOSIS — M25569 Pain in unspecified knee: Secondary | ICD-10-CM | POA: Diagnosis not present

## 2024-08-17 DIAGNOSIS — E1165 Type 2 diabetes mellitus with hyperglycemia: Secondary | ICD-10-CM | POA: Diagnosis not present

## 2024-08-17 DIAGNOSIS — Z6829 Body mass index (BMI) 29.0-29.9, adult: Secondary | ICD-10-CM | POA: Diagnosis not present

## 2024-08-18 DIAGNOSIS — M25511 Pain in right shoulder: Secondary | ICD-10-CM | POA: Diagnosis not present

## 2024-08-20 DIAGNOSIS — M25511 Pain in right shoulder: Secondary | ICD-10-CM | POA: Diagnosis not present

## 2024-08-24 DIAGNOSIS — M25511 Pain in right shoulder: Secondary | ICD-10-CM | POA: Diagnosis not present

## 2024-08-25 DIAGNOSIS — E349 Endocrine disorder, unspecified: Secondary | ICD-10-CM | POA: Diagnosis not present

## 2024-08-25 DIAGNOSIS — Z125 Encounter for screening for malignant neoplasm of prostate: Secondary | ICD-10-CM | POA: Diagnosis not present

## 2024-08-26 DIAGNOSIS — M25511 Pain in right shoulder: Secondary | ICD-10-CM | POA: Diagnosis not present

## 2024-08-31 DIAGNOSIS — M25511 Pain in right shoulder: Secondary | ICD-10-CM | POA: Diagnosis not present

## 2024-09-01 DIAGNOSIS — E119 Type 2 diabetes mellitus without complications: Secondary | ICD-10-CM | POA: Diagnosis not present

## 2024-09-01 DIAGNOSIS — E349 Endocrine disorder, unspecified: Secondary | ICD-10-CM | POA: Diagnosis not present

## 2024-09-01 DIAGNOSIS — H04123 Dry eye syndrome of bilateral lacrimal glands: Secondary | ICD-10-CM | POA: Diagnosis not present

## 2024-09-01 DIAGNOSIS — N5201 Erectile dysfunction due to arterial insufficiency: Secondary | ICD-10-CM | POA: Diagnosis not present

## 2024-09-02 DIAGNOSIS — M25511 Pain in right shoulder: Secondary | ICD-10-CM | POA: Diagnosis not present

## 2024-09-06 DIAGNOSIS — M25561 Pain in right knee: Secondary | ICD-10-CM | POA: Diagnosis not present

## 2024-09-14 DIAGNOSIS — E1165 Type 2 diabetes mellitus with hyperglycemia: Secondary | ICD-10-CM | POA: Diagnosis not present

## 2024-09-14 DIAGNOSIS — E785 Hyperlipidemia, unspecified: Secondary | ICD-10-CM | POA: Diagnosis not present

## 2024-09-14 DIAGNOSIS — M25511 Pain in right shoulder: Secondary | ICD-10-CM | POA: Diagnosis not present

## 2024-09-16 DIAGNOSIS — M25511 Pain in right shoulder: Secondary | ICD-10-CM | POA: Diagnosis not present

## 2024-09-17 DIAGNOSIS — E790 Hyperuricemia without signs of inflammatory arthritis and tophaceous disease: Secondary | ICD-10-CM | POA: Diagnosis not present

## 2024-09-17 DIAGNOSIS — I10 Essential (primary) hypertension: Secondary | ICD-10-CM | POA: Diagnosis not present

## 2024-09-17 DIAGNOSIS — E291 Testicular hypofunction: Secondary | ICD-10-CM | POA: Diagnosis not present

## 2024-09-17 DIAGNOSIS — E782 Mixed hyperlipidemia: Secondary | ICD-10-CM | POA: Diagnosis not present

## 2024-09-17 DIAGNOSIS — E1165 Type 2 diabetes mellitus with hyperglycemia: Secondary | ICD-10-CM | POA: Diagnosis not present

## 2024-09-17 DIAGNOSIS — K219 Gastro-esophageal reflux disease without esophagitis: Secondary | ICD-10-CM | POA: Diagnosis not present

## 2024-09-20 DIAGNOSIS — M25511 Pain in right shoulder: Secondary | ICD-10-CM | POA: Diagnosis not present

## 2024-09-21 ENCOUNTER — Other Ambulatory Visit (HOSPITAL_BASED_OUTPATIENT_CLINIC_OR_DEPARTMENT_OTHER): Payer: Self-pay | Admitting: Physician Assistant

## 2024-09-21 DIAGNOSIS — E782 Mixed hyperlipidemia: Secondary | ICD-10-CM

## 2024-09-21 DIAGNOSIS — E1165 Type 2 diabetes mellitus with hyperglycemia: Secondary | ICD-10-CM

## 2024-09-27 DIAGNOSIS — M5459 Other low back pain: Secondary | ICD-10-CM | POA: Diagnosis not present

## 2024-09-28 DIAGNOSIS — M25511 Pain in right shoulder: Secondary | ICD-10-CM | POA: Diagnosis not present

## 2024-09-30 DIAGNOSIS — M25511 Pain in right shoulder: Secondary | ICD-10-CM | POA: Diagnosis not present

## 2024-10-05 DIAGNOSIS — M25511 Pain in right shoulder: Secondary | ICD-10-CM | POA: Diagnosis not present

## 2024-10-12 DIAGNOSIS — M25511 Pain in right shoulder: Secondary | ICD-10-CM | POA: Diagnosis not present

## 2024-10-15 DIAGNOSIS — M25511 Pain in right shoulder: Secondary | ICD-10-CM | POA: Diagnosis not present

## 2024-10-19 ENCOUNTER — Ambulatory Visit (HOSPITAL_BASED_OUTPATIENT_CLINIC_OR_DEPARTMENT_OTHER)
Admission: RE | Admit: 2024-10-19 | Discharge: 2024-10-19 | Disposition: A | Discharge status: Home / Self Care | Payer: Self-pay | Source: Ambulatory Visit | Attending: Physician Assistant | Admitting: Physician Assistant

## 2024-10-19 DIAGNOSIS — E1165 Type 2 diabetes mellitus with hyperglycemia: Secondary | ICD-10-CM

## 2024-10-19 DIAGNOSIS — E782 Mixed hyperlipidemia: Secondary | ICD-10-CM

## 2024-11-09 DIAGNOSIS — M25512 Pain in left shoulder: Secondary | ICD-10-CM | POA: Diagnosis not present
